# Patient Record
Sex: Female | Born: 1985 | Race: White | Hispanic: No | Marital: Single | Smoking: Current every day smoker
Health system: Southern US, Community
[De-identification: ages and names within clinical notes are randomized; demographics above are authoritative.]

## PROBLEM LIST (undated history)

## (undated) DIAGNOSIS — F1990 Other psychoactive substance use, unspecified, uncomplicated: Secondary | ICD-10-CM

---
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
## 2005-01-12 ENCOUNTER — Emergency Department: Payer: Self-pay | Admitting: Emergency Medicine

## 2005-06-01 ENCOUNTER — Emergency Department: Payer: Self-pay | Admitting: Emergency Medicine

## 2006-01-14 ENCOUNTER — Observation Stay: Payer: Self-pay | Admitting: Obstetrics & Gynecology

## 2006-01-23 ENCOUNTER — Observation Stay: Payer: Self-pay

## 2006-01-29 ENCOUNTER — Observation Stay: Payer: Self-pay | Admitting: Obstetrics & Gynecology

## 2006-01-30 ENCOUNTER — Inpatient Hospital Stay: Payer: Self-pay | Admitting: Obstetrics and Gynecology

## 2006-04-15 ENCOUNTER — Emergency Department: Payer: Self-pay | Admitting: Emergency Medicine

## 2007-12-22 ENCOUNTER — Emergency Department: Payer: Self-pay | Admitting: Emergency Medicine

## 2010-11-02 ENCOUNTER — Emergency Department: Payer: Self-pay | Admitting: Emergency Medicine

## 2010-11-23 ENCOUNTER — Emergency Department: Payer: Self-pay | Admitting: Emergency Medicine

## 2010-12-14 ENCOUNTER — Emergency Department: Payer: Self-pay | Admitting: Internal Medicine

## 2011-01-09 ENCOUNTER — Emergency Department: Payer: Self-pay | Admitting: Emergency Medicine

## 2011-01-20 ENCOUNTER — Emergency Department: Payer: Self-pay | Admitting: Internal Medicine

## 2011-01-27 ENCOUNTER — Emergency Department: Payer: Self-pay | Admitting: Emergency Medicine

## 2011-03-29 ENCOUNTER — Emergency Department: Payer: Self-pay | Admitting: *Deleted

## 2012-02-09 ENCOUNTER — Emergency Department: Payer: Self-pay | Admitting: Emergency Medicine

## 2012-02-09 LAB — URINALYSIS, COMPLETE
Bilirubin,UR: NEGATIVE
Blood: NEGATIVE
Glucose,UR: NEGATIVE mg/dL (ref 0–75)
Ketone: NEGATIVE
Specific Gravity: 1.004 (ref 1.003–1.030)
Squamous Epithelial: 2
WBC UR: 4 /HPF (ref 0–5)

## 2012-02-09 LAB — CBC WITH DIFFERENTIAL/PLATELET
Basophil #: 0.1 10*3/uL (ref 0.0–0.1)
Basophil %: 0.5 %
Eosinophil %: 0.4 %
HCT: 44.7 % (ref 35.0–47.0)
HGB: 15.4 g/dL (ref 12.0–16.0)
Lymphocyte #: 0.5 10*3/uL — ABNORMAL LOW (ref 1.0–3.6)
Lymphocyte %: 4.8 %
MCV: 87 fL (ref 80–100)
Monocyte #: 0.1 x10 3/mm — ABNORMAL LOW (ref 0.2–0.9)
Monocyte %: 0.6 %
Neutrophil #: 10.7 10*3/uL — ABNORMAL HIGH (ref 1.4–6.5)
RBC: 5.14 10*6/uL (ref 3.80–5.20)
RDW: 14.7 % — ABNORMAL HIGH (ref 11.5–14.5)
WBC: 11.4 10*3/uL — ABNORMAL HIGH (ref 3.6–11.0)

## 2012-02-09 LAB — BASIC METABOLIC PANEL
BUN: 6 mg/dL — ABNORMAL LOW (ref 7–18)
Calcium, Total: 9.1 mg/dL (ref 8.5–10.1)
Chloride: 105 mmol/L (ref 98–107)
Co2: 25 mmol/L (ref 21–32)
Creatinine: 0.82 mg/dL (ref 0.60–1.30)
EGFR (African American): >60
EGFR (Non-African Amer.): >60
Glucose: 115 mg/dL — ABNORMAL HIGH (ref 65–99)
Osmolality: 278 (ref 275–301)
Sodium: 140 mmol/L (ref 136–145)

## 2012-02-09 LAB — HEPATIC FUNCTION PANEL A (ARMC)
Albumin: 3.7 g/dL (ref 3.4–5.0)
Bilirubin, Direct: 0.1 mg/dL (ref 0.00–0.20)
Bilirubin,Total: 0.7 mg/dL (ref 0.2–1.0)

## 2012-02-09 LAB — LIPASE, BLOOD: Lipase: 99 U/L (ref 73–393)

## 2012-07-15 ENCOUNTER — Emergency Department: Payer: Self-pay | Admitting: Emergency Medicine

## 2012-07-16 LAB — COMPREHENSIVE METABOLIC PANEL
Albumin: 3.4 g/dL (ref 3.4–5.0)
Anion Gap: 7 (ref 7–16)
Bilirubin,Total: 0.2 mg/dL (ref 0.2–1.0)
Calcium, Total: 8.1 mg/dL — ABNORMAL LOW (ref 8.5–10.1)
Chloride: 111 mmol/L — ABNORMAL HIGH (ref 98–107)
Co2: 25 mmol/L (ref 21–32)
Creatinine: 0.73 mg/dL (ref 0.60–1.30)
EGFR (African American): >60
Glucose: 100 mg/dL — ABNORMAL HIGH (ref 65–99)
Osmolality: 283 (ref 275–301)
Potassium: 3.3 mmol/L — ABNORMAL LOW (ref 3.5–5.1)
SGPT (ALT): 22 U/L (ref 12–78)
Sodium: 143 mmol/L (ref 136–145)

## 2012-07-16 LAB — CBC
HCT: 40.1 % (ref 35.0–47.0)
MCH: 27.9 pg (ref 26.0–34.0)
MCV: 89 fL (ref 80–100)
RDW: 13.7 % (ref 11.5–14.5)
WBC: 12.4 10*3/uL — ABNORMAL HIGH (ref 3.6–11.0)

## 2012-10-19 ENCOUNTER — Emergency Department: Payer: Self-pay | Admitting: Emergency Medicine

## 2012-11-02 ENCOUNTER — Emergency Department: Payer: Self-pay | Admitting: Emergency Medicine

## 2012-12-29 ENCOUNTER — Emergency Department: Payer: Self-pay | Admitting: Emergency Medicine

## 2013-01-06 IMAGING — CT CT ABD-PELV W/O CM
1 of 2 series · 15 of 32 positions shown, 19 images · non-contrast
Comparison: none

REASON FOR EXAM: (1) b/l severe flank pain with unremarkable urinalysis x
2rbc. hx prior renal su
COMMENTS:

[Series 2: soft tissue · axial · 0.68mm/px · z∈[-518,-102]mm · 15 of 153 slices shown, 19 images]
[im 7/153  soft-tissue]
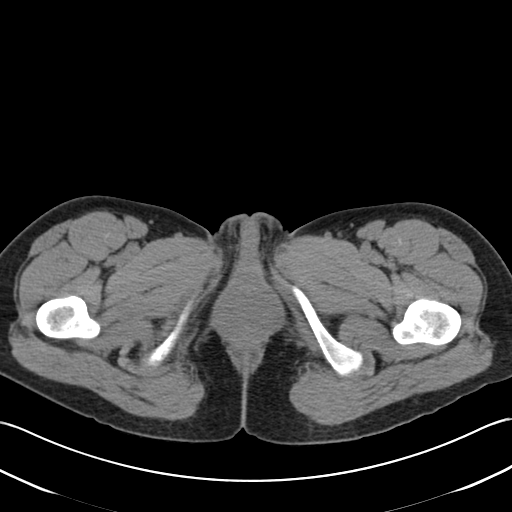
[im 7/153  bone]
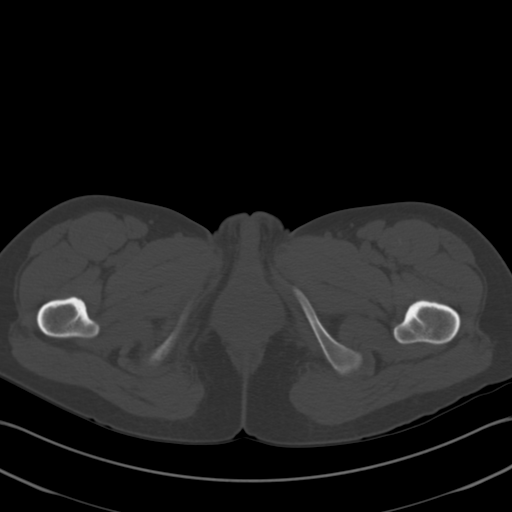
[im 20/153  soft-tissue]
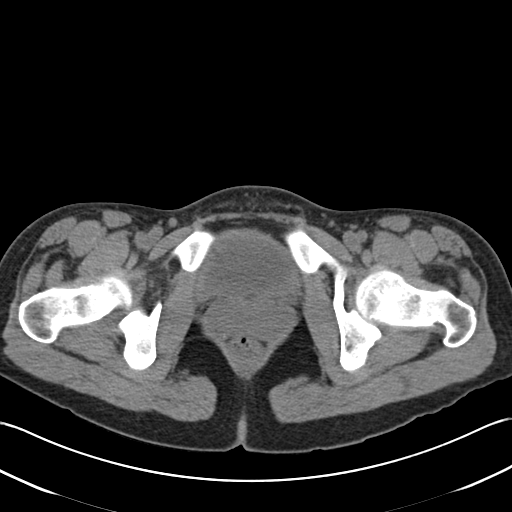
[im 32/153  soft-tissue]
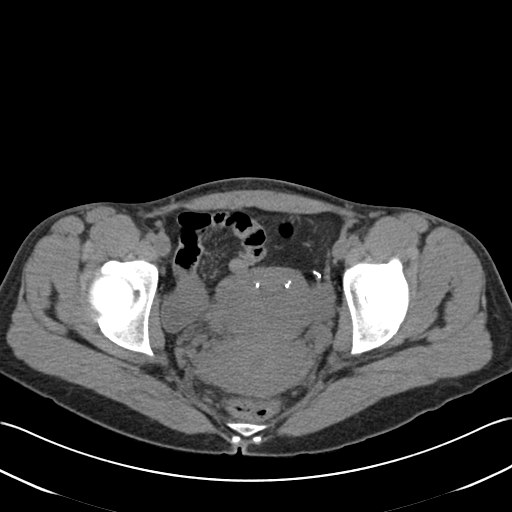
[im 45/153  soft-tissue]
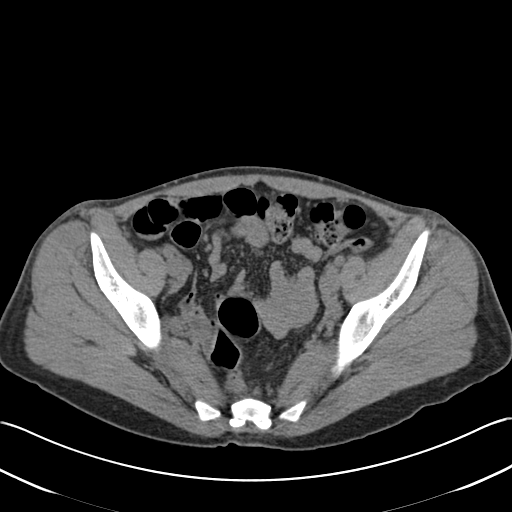
[im 51/153  soft-tissue]
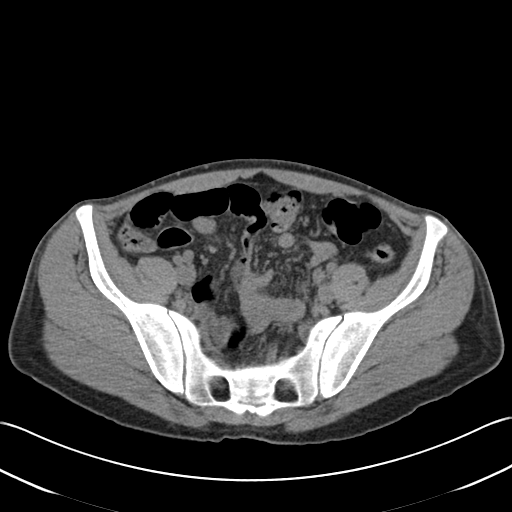
[im 64/153  soft-tissue]
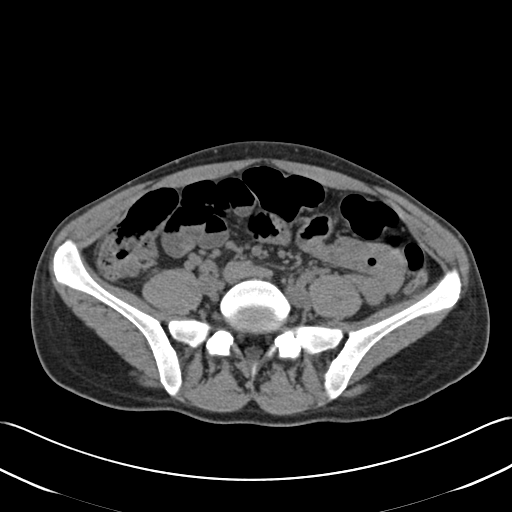
[im 77/153  soft-tissue]
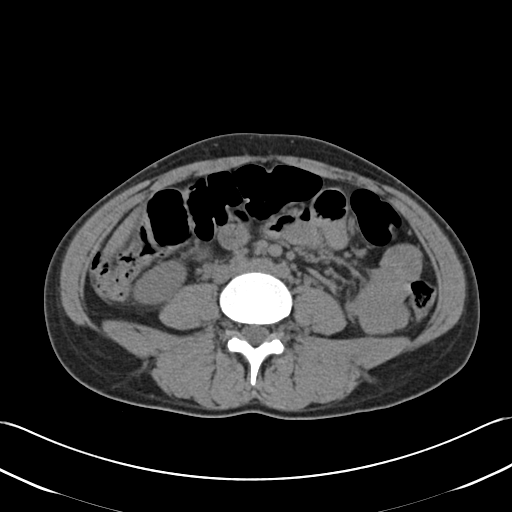
[im 89/153  soft-tissue]
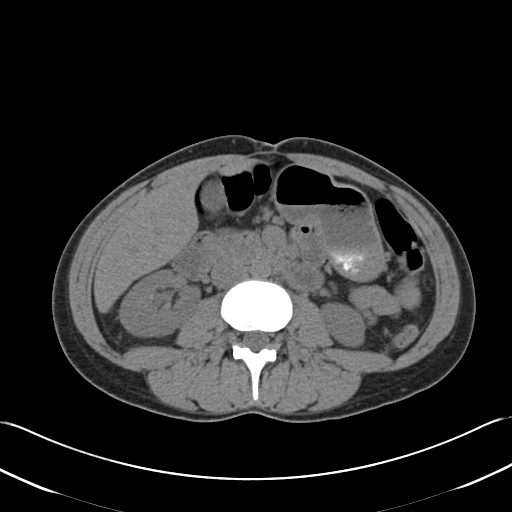
[im 102/153  soft-tissue]
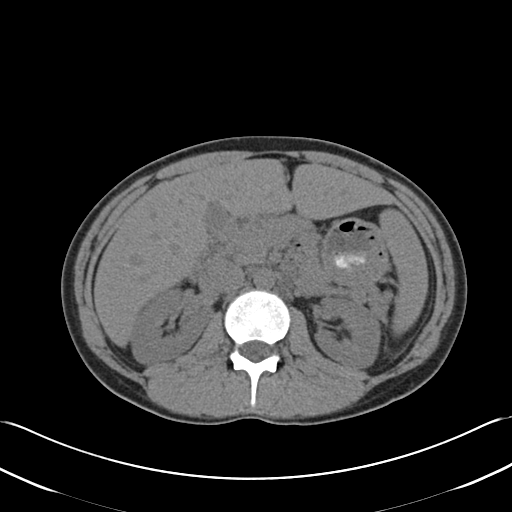
[im 102/153  bone]
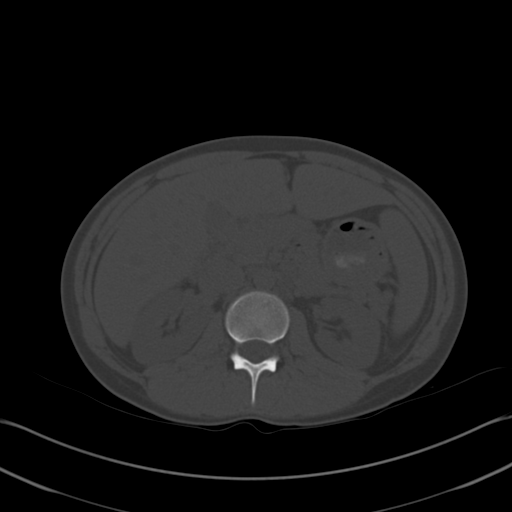
[im 108/153  soft-tissue]
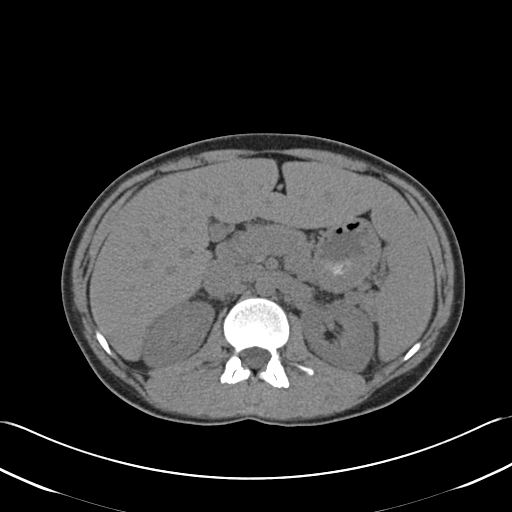
[im 121/153  soft-tissue]
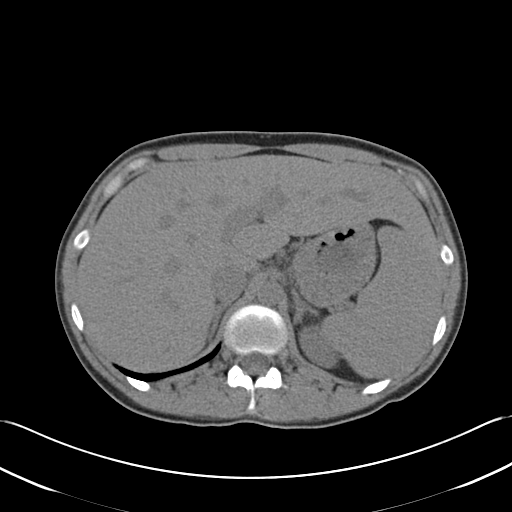
[im 127/153  lung]
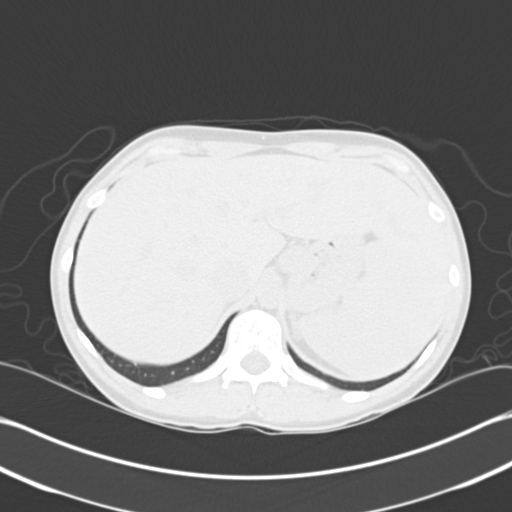
[im 134/153  soft-tissue]
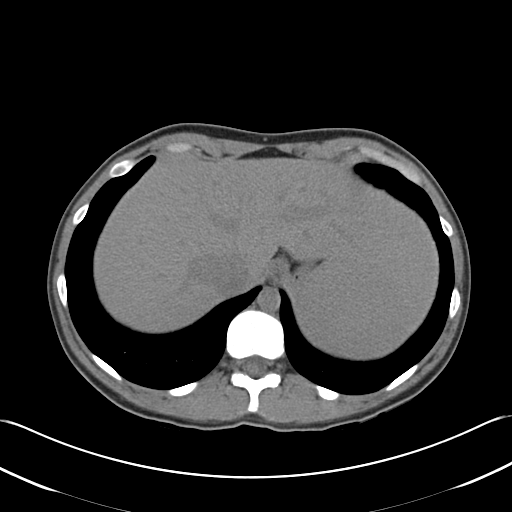
[im 134/153  lung]
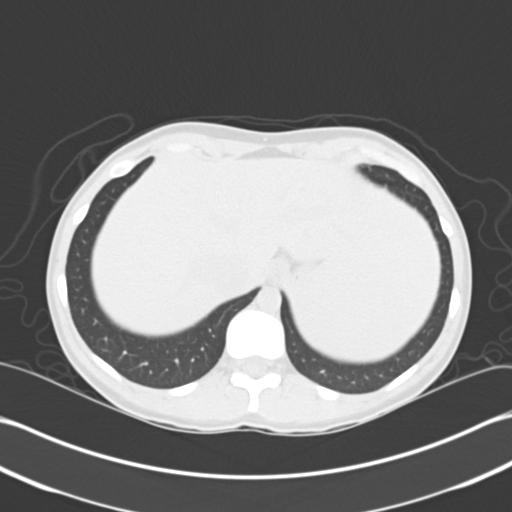
[im 140/153  lung]
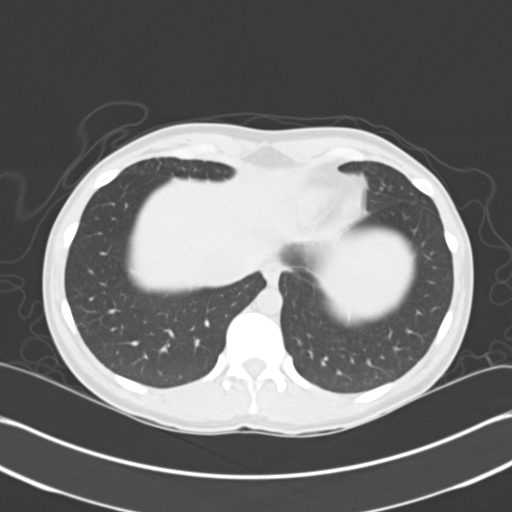
[im 146/153  soft-tissue]
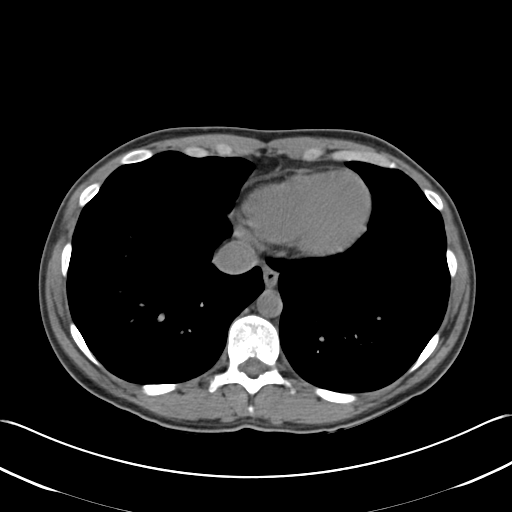
[im 146/153  lung]
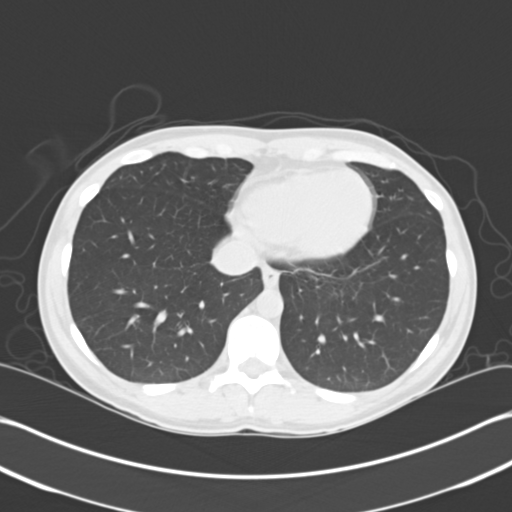

[15 of 32 positions shown; findings below may reference images not displayed]

PROCEDURE:     CT  - CT ABDOMEN AND PELVIS W[DATE]  [DATE]

RESULT:     CT of the abdomen and pelvis without contrast is performed in
the standard fashion with images reconstructed at 3.0 mm slice thickness in
the axial plane. There is no previous study for comparison.

There is no nephrolithiasis or hydronephrosis. No perinephric stranding or
abnormal perinephric fluid collection is seen. The urinary bladder is
unremarkable. The appendix is normal in appearance. No abnormal bowel
distention or wall thickening is evident. Noncontrast images of the liver,
spleen, gallbladder, adrenal glands, aorta and pancreas appear within normal
limits. There is no ascites or abnormal fluid collection. The abdominal wall
appears intact. The lung bases are clear.
IMPRESSION: 1. Unremarkable CT of the abdomen and pelvis.

[REDACTED]

## 2013-03-31 ENCOUNTER — Emergency Department: Payer: Self-pay | Admitting: Emergency Medicine

## 2014-09-11 NOTE — Op Note (Signed)
PATIENT NAME:  Victoria Glover, Victoria Glover MR#:  [id number] DATE OF BIRTH:  Jan 10, 1986  DATE OF PROCEDURE:  07/16/2012  PREOPERATIVE DIAGNOSIS: Left elbow abscess.   POSTOPERATIVE DIAGNOSIS: Left elbow abscess.   OPERATION: Incision and drainage.   ANESTHESIA: Local.   SURGEON: Quentin Orealph L. Ely, III, MD   OPERATIVE PROCEDURE: With the patient in the supine position after the induction of appropriate superficial prep, the abscess was injected with 4 mL of 1% Xylocaine without epinephrine. The area had been adequately prepped. An 11 blade was used to incise the abscess.  A large amount of purulent material was removed. It was too difficult to place a packing in the wound because of her discomfort. A small corner of a 4 x 4 was inserted between the skin edge to keep it apart and the arm wrapped sterilely. The patient was given pain medication and will be evaluated as an outpatient.    ____________________________ Carmie Endalph L. Ely III, MD rle:cb D: 07/16/2012 04:26:06 ET T: 07/16/2012 11:01:34 ET JOB#: [id number]  cc: Quentin Orealph L. Ely III, MD, <Dictator> Quentin OreALPH L ELY MD ELECTRONICALLY SIGNED 07/16/2012 23:19

## 2015-07-16 ENCOUNTER — Encounter: Payer: Self-pay | Admitting: Emergency Medicine

## 2015-07-16 ENCOUNTER — Emergency Department
Admission: EM | Admit: 2015-07-16 | Discharge: 2015-07-17 | Disposition: A | Discharge status: Other Acute Inpatient Hospital | Payer: Self-pay | Attending: Emergency Medicine | Admitting: Emergency Medicine

## 2015-07-16 DIAGNOSIS — F1994 Other psychoactive substance use, unspecified with psychoactive substance-induced mood disorder: Secondary | ICD-10-CM

## 2015-07-16 DIAGNOSIS — F1210 Cannabis abuse, uncomplicated: Secondary | ICD-10-CM | POA: Insufficient documentation

## 2015-07-16 DIAGNOSIS — F1110 Opioid abuse, uncomplicated: Secondary | ICD-10-CM | POA: Insufficient documentation

## 2015-07-16 DIAGNOSIS — Z3202 Encounter for pregnancy test, result negative: Secondary | ICD-10-CM | POA: Insufficient documentation

## 2015-07-16 DIAGNOSIS — R451 Restlessness and agitation: Secondary | ICD-10-CM | POA: Insufficient documentation

## 2015-07-16 DIAGNOSIS — F1510 Other stimulant abuse, uncomplicated: Secondary | ICD-10-CM | POA: Insufficient documentation

## 2015-07-16 DIAGNOSIS — F1410 Cocaine abuse, uncomplicated: Secondary | ICD-10-CM | POA: Insufficient documentation

## 2015-07-16 DIAGNOSIS — R1030 Lower abdominal pain, unspecified: Secondary | ICD-10-CM | POA: Insufficient documentation

## 2015-07-16 DIAGNOSIS — F17210 Nicotine dependence, cigarettes, uncomplicated: Secondary | ICD-10-CM | POA: Insufficient documentation

## 2015-07-16 HISTORY — DX: Other psychoactive substance use, unspecified, uncomplicated: F19.90

## 2015-07-16 LAB — COMPREHENSIVE METABOLIC PANEL
ALK PHOS: 97 U/L (ref 38–126)
ALT: 109 U/L — ABNORMAL HIGH (ref 14–54)
ANION GAP: 7 (ref 5–15)
AST: 59 U/L — ABNORMAL HIGH (ref 15–41)
Albumin: 4.1 g/dL (ref 3.5–5.0)
BILIRUBIN TOTAL: 0.6 mg/dL (ref 0.3–1.2)
BUN: 17 mg/dL (ref 6–20)
CALCIUM: 8.8 mg/dL — AB (ref 8.9–10.3)
CO2: 21 mmol/L — ABNORMAL LOW (ref 22–32)
Chloride: 105 mmol/L (ref 101–111)
Creatinine, Ser: 1.24 mg/dL — ABNORMAL HIGH (ref 0.44–1.00)
GFR calc non Af Amer: 58 mL/min — ABNORMAL LOW (ref >60)
Glucose, Bld: 130 mg/dL — ABNORMAL HIGH (ref 65–99)
Potassium: 4.1 mmol/L (ref 3.5–5.1)
Sodium: 133 mmol/L — ABNORMAL LOW (ref 135–145)
TOTAL PROTEIN: 7.9 g/dL (ref 6.5–8.1)

## 2015-07-16 LAB — CBC
HCT: 42.5 % (ref 35.0–47.0)
Hemoglobin: 14 g/dL (ref 12.0–16.0)
MCH: 30.1 pg (ref 26.0–34.0)
MCHC: 32.9 g/dL (ref 32.0–36.0)
MCV: 91.3 fL (ref 80.0–100.0)
Platelets: 285 10*3/uL (ref 150–440)
RBC: 4.66 MIL/uL (ref 3.80–5.20)
RDW: 13.8 % (ref 11.5–14.5)
WBC: 15 10*3/uL — ABNORMAL HIGH (ref 3.6–11.0)

## 2015-07-16 LAB — SALICYLATE LEVEL

## 2015-07-16 LAB — ACETAMINOPHEN LEVEL

## 2015-07-16 LAB — ETHANOL

## 2015-07-16 MED ORDER — ZIPRASIDONE MESYLATE 20 MG IM SOLR
20.0000 mg | Freq: Once | INTRAMUSCULAR | Status: AC
  Administered 2015-07-16: 20 mg via INTRAMUSCULAR
  Filled 2015-07-16: qty 20

## 2015-07-16 NOTE — ED Notes (Signed)
Spoke with representative from [facility] and states pt is too hysterical at present, jumping at the camera and yelling.. That it she has calmed down in a couple of hours they can reattempt.Marland Kitchen
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
## 2015-07-16 NOTE — BH Assessment (Signed)
BHH Assessment Progress Note Patient was highly agitated and declined assessment. Patient was very upset and difficult to understand, patient became highly agitated screaming expletives at this writer stating she should not be here.  Patient was also vomiting and could not stay in her bed going out of view of the tele-psych camera.This Clinical research associate contacted [facility] and spoke to staff in reference to taking the assessment order out and having patient assessed later this date once she is calmer. Patient UDS is pending.
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
## 2015-07-16 NOTE — ED Notes (Signed)
Pt yelling and cussing at staff, throwing stuff around the room. This tech has spoke with pt about using profanity and yelling at staff and pt continues
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
## 2015-07-16 NOTE — BHH Counselor (Signed)
Pt continues to not participate in the assessment.  Pt UDS is still pending.

## 2015-07-16 NOTE — ED Notes (Signed)
Spoke with pt boyfriend Neita Goodnight, states he can be contacted at [phone] and [phone] states he lives 2.5hrs away.. States the pt came home today to visit her fathers grave, states she gets upset a lot when she talks about her father.Marland Kitchen

## 2015-07-17 ENCOUNTER — Inpatient Hospital Stay
Admission: RE | Admit: 2015-07-17 | Discharge: 2015-07-18 | DRG: 897 | Disposition: A | Discharge status: Home / Self Care | Payer: Self-pay | Source: Ambulatory Visit | Attending: Psychiatry | Admitting: Psychiatry

## 2015-07-17 DIAGNOSIS — Z888 Allergy status to other drugs, medicaments and biological substances status: Secondary | ICD-10-CM

## 2015-07-17 DIAGNOSIS — F17200 Nicotine dependence, unspecified, uncomplicated: Secondary | ICD-10-CM | POA: Diagnosis present

## 2015-07-17 DIAGNOSIS — R45851 Suicidal ideations: Secondary | ICD-10-CM | POA: Diagnosis present

## 2015-07-17 DIAGNOSIS — Z62810 Personal history of physical and sexual abuse in childhood: Secondary | ICD-10-CM | POA: Diagnosis present

## 2015-07-17 DIAGNOSIS — F1120 Opioid dependence, uncomplicated: Secondary | ICD-10-CM | POA: Diagnosis present

## 2015-07-17 DIAGNOSIS — F1590 Other stimulant use, unspecified, uncomplicated: Secondary | ICD-10-CM | POA: Diagnosis present

## 2015-07-17 DIAGNOSIS — D72829 Elevated white blood cell count, unspecified: Secondary | ICD-10-CM | POA: Diagnosis present

## 2015-07-17 DIAGNOSIS — F1994 Other psychoactive substance use, unspecified with psychoactive substance-induced mood disorder: Secondary | ICD-10-CM

## 2015-07-17 DIAGNOSIS — F1914 Other psychoactive substance abuse with psychoactive substance-induced mood disorder: Principal | ICD-10-CM | POA: Diagnosis present

## 2015-07-17 DIAGNOSIS — F1220 Cannabis dependence, uncomplicated: Secondary | ICD-10-CM | POA: Diagnosis present

## 2015-07-17 DIAGNOSIS — N390 Urinary tract infection, site not specified: Secondary | ICD-10-CM | POA: Diagnosis present

## 2015-07-17 DIAGNOSIS — F17210 Nicotine dependence, cigarettes, uncomplicated: Secondary | ICD-10-CM | POA: Diagnosis present

## 2015-07-17 LAB — URINE DRUG SCREEN, QUALITATIVE (ARMC ONLY)
AMPHETAMINES, UR SCREEN: POSITIVE — AB
Barbiturates, Ur Screen: NOT DETECTED
Benzodiazepine, Ur Scrn: NOT DETECTED
Cannabinoid 50 Ng, Ur ~~LOC~~: POSITIVE — AB
Cocaine Metabolite,Ur ~~LOC~~: POSITIVE — AB
MDMA (ECSTASY) UR SCREEN: NOT DETECTED
Methadone Scn, Ur: NOT DETECTED
Opiate, Ur Screen: POSITIVE — AB
PHENCYCLIDINE (PCP) UR S: NOT DETECTED
TRICYCLIC, UR SCREEN: NOT DETECTED

## 2015-07-17 LAB — URINALYSIS COMPLETE WITH MICROSCOPIC (ARMC ONLY)
Bilirubin Urine: NEGATIVE
GLUCOSE, UA: NEGATIVE mg/dL
Ketones, ur: NEGATIVE mg/dL
NITRITE: POSITIVE — AB
PROTEIN: NEGATIVE mg/dL
Specific Gravity, Urine: 1.016 (ref 1.005–1.030)
pH: 5 (ref 5.0–8.0)

## 2015-07-17 LAB — POCT PREGNANCY, URINE: PREG TEST UR: NEGATIVE

## 2015-07-17 MED ORDER — DIPHENHYDRAMINE HCL 25 MG PO CAPS
50.0000 mg | ORAL_CAPSULE | Freq: Once | ORAL | Status: AC
  Administered 2015-07-17: 50 mg via ORAL

## 2015-07-17 MED ORDER — FOSFOMYCIN TROMETHAMINE 3 G PO PACK
3.0000 g | PACK | Freq: Once | ORAL | Status: AC
  Administered 2015-07-17: 3 g via ORAL
  Filled 2015-07-17: qty 3

## 2015-07-17 MED ORDER — DIPHENHYDRAMINE HCL 25 MG PO CAPS
ORAL_CAPSULE | ORAL | Status: AC
  Administered 2015-07-17: 50 mg via ORAL
  Filled 2015-07-17: qty 2

## 2015-07-17 NOTE — ED Notes (Addendum)
Pt taken to bhu for a shower and will stay in bhu while waiting for admission to [facility].  Report given to [employer].

## 2015-07-17 NOTE — ED Provider Notes (Signed)
-----------------------------------------   10:44 AM on 07/17/2015 -----------------------------------------  Urinalysis is reviewed and is concerning for urinary tract infection. 3 g of fosfomycin ordered. Urine culture sent.  Gayla Doss, MD 07/17/15 1044

## 2015-07-17 NOTE — BH Assessment (Addendum)
Per Dr. Pucilowska, patient meets criteria for inpatient hospitalization and will be accepted to [facility] Unit.  Per Charge Nurse patient can come to the unit this afternoon.  Writer informed the ER MD (Dr. Gail) and the nurse working with the patient.    

## 2015-07-17 NOTE — ED Notes (Signed)
Patient came to nurse and stated that she had a rash on her stomach and back. Patient stated that she was itchy and nurse noticed that there were several small red bumps on both of her sides and the left side of her back. Awaiting orders from physician and physician assessment. Will continue to monitor.

## 2015-07-17 NOTE — ED Notes (Signed)
Pt is on the phone with Victoria Glover and is cursing and raising voice at him.  Pt briefly spoke to him and hung up on him.  Pt ambulated to bathroom without difficulty and was able to give a urine sample.  Pt discussing with myself wanting to leave.  Informed patient that she is not able to leave and that the psychiatrist will need to evaluate her first.  Also informed her that there is no certain time the psychiatrist will come and see her, but that was the next step in her treatment here.  Pt verbalized understanding, but was still agitated.  Pt did not yell at staff or make any gestures toward staff. Denies SI/HI.

## 2015-07-17 NOTE — Consult Note (Signed)
Reason for Consult:Agitation, Drug abuse  Referring Physician: [facility] EDP  Victoria Glover is an 30 y.o. female who was brought to the [facility] ED via police after they received calls that patient was found unresponsive in a neighborhood. Patient states "I don't know how I came to be here. The police brought me. I was just hanging out with my boyfriend and cousin. They were going to get some drugs. I've never been diagnosed with a condition. I'm always hyper and high strung. Always have a lot of energy. Sometimes I don't need much sleep for a day or two. Yes I get depressed. My father died when I was [age] and the anniversary just happened. I don't want to be on psychiatric medications. I smoke marijuana every day to help my anxiety and so I can eat. I take a friend's Adderrall occasionally because it helps me focus. I don't know how cocaine got into my system. I was drinking yesterday because I was depressed about my dad. I'm just angry I"m here. I want to go home."   HPI:   Victoria Glover is a 30 year old female who was placed under IVC by the police due to being found walking around confused. She is a poor historian in regards to the events leading up to this. Patient denies any past psychiatric history and is not currently receiving any mental health treatment. Shelle is cooperative with assessment but appears quite tense. She greatly minimizes her substance use as well as mental health symptoms. Patient reports symptoms that are consistent with anxiety, depression, and mood lability. Patient reports that she is not interested in receiving any mental health treatment but would prefer to continue smoking marijuana daily to control her anxiety and help stimulate appetite. She denies haven taken any previous psychiatric medications previously.  Her mood is angry and affect is congruent. Her insight is lacking and judgment is impaired. Her remote memory is intact but recent if fair most likely from substance  use. Patient denies any current symptoms of psychosis. She denies any suicidal or homicidal thoughts. Patient reports symptoms such as decreased need for sleep, racing thought, excess energy that could be suggestive of hypomania but unclear given her recent substance abuse. Her urine drug screen is positive for amphetamines, cocaine, marijuana, and opiates. Patient reports taking some narcotic pain pills for a foot injury but insists not taking any for several weeks. According to admission notes from the ED in epic that bystanders found the patient on the ground unresponsive. This is not consistent with the report that patient provided to this provider. The patient is currently being treated for a urinary tract infection. Patient appears to be a danger to herself at this time due to her substance abuse and untreated mental health symptoms. She was also very combative on arrival to the ED which required sedation with IM Geodon. Her thought processes are coherent. Her thought content involves not knowing why she is in the ED and wanting to leave. Patient is motivated to continue abusing substances.    Past Medical History  Diagnosis Date  . Drug use     History reviewed. No pertinent past surgical history.  No family history on file.  Social History:  reports that she has been smoking Cigarettes.  She has been smoking about 1.00 pack per day. She does not have any smokeless tobacco history on file. She reports that she drinks alcohol. She reports that she uses illicit drugs (Marijuana).  Allergies:  Allergies  Allergen  Reactions  . Acetaminophen Hives    Medications: I have reviewed the patient's current medications.  Results for orders placed or performed during the hospital encounter of 07/16/15 (from the past 48 hour(s))  Comprehensive metabolic panel     Status: Abnormal   Collection Time: 07/16/15  3:27 PM  Result Value Ref Range   Sodium 133 (L) 135 - 145 mmol/L   Potassium 4.1 3.5 -  5.1 mmol/L   Chloride 105 101 - 111 mmol/L   CO2 21 (L) 22 - 32 mmol/L   Glucose, Bld 130 (H) 65 - 99 mg/dL   BUN 17 6 - 20 mg/dL   Creatinine, Ser 1.24 (H) 0.44 - 1.00 mg/dL   Calcium 8.8 (L) 8.9 - 10.3 mg/dL   Total Protein 7.9 6.5 - 8.1 g/dL   Albumin 4.1 3.5 - 5.0 g/dL   AST 59 (H) 15 - 41 U/L   ALT 109 (H) 14 - 54 U/L   Alkaline Phosphatase 97 38 - 126 U/L   Total Bilirubin 0.6 0.3 - 1.2 mg/dL   GFR calc non Af Amer 58 (L) >60 mL/min   GFR calc Af Amer >60 >60 mL/min    Comment: (NOTE) The eGFR has been calculated using the CKD EPI equation. This calculation has not been validated in all clinical situations. eGFR's persistently <60 mL/min signify possible Chronic Kidney Disease.    Anion gap 7 5 - 15  Ethanol (ETOH)     Status: None   Collection Time: 07/16/15  3:27 PM  Result Value Ref Range   Alcohol, Ethyl (B) <5 <5 mg/dL    Comment:        LOWEST DETECTABLE LIMIT FOR SERUM ALCOHOL IS 5 mg/dL FOR MEDICAL PURPOSES ONLY   Salicylate level     Status: None   Collection Time: 07/16/15  3:27 PM  Result Value Ref Range   Salicylate Lvl <9.5 2.8 - 30.0 mg/dL  Acetaminophen level     Status: Abnormal   Collection Time: 07/16/15  3:27 PM  Result Value Ref Range   Acetaminophen (Tylenol), Serum <10 (L) 10 - 30 ug/mL    Comment:        THERAPEUTIC CONCENTRATIONS VARY SIGNIFICANTLY. A RANGE OF 10-30 ug/mL MAY BE AN EFFECTIVE CONCENTRATION FOR MANY PATIENTS. HOWEVER, SOME ARE BEST TREATED AT CONCENTRATIONS OUTSIDE THIS RANGE. ACETAMINOPHEN CONCENTRATIONS >150 ug/mL AT 4 HOURS AFTER INGESTION AND >50 ug/mL AT 12 HOURS AFTER INGESTION ARE OFTEN ASSOCIATED WITH TOXIC REACTIONS.   CBC     Status: Abnormal   Collection Time: 07/16/15  3:27 PM  Result Value Ref Range   WBC 15.0 (H) 3.6 - 11.0 K/uL   RBC 4.66 3.80 - 5.20 MIL/uL   Hemoglobin 14.0 12.0 - 16.0 g/dL   HCT 42.5 35.0 - 47.0 %   MCV 91.3 80.0 - 100.0 fL   MCH 30.1 26.0 - 34.0 pg   MCHC 32.9 32.0 - 36.0  g/dL   RDW 13.8 11.5 - 14.5 %   Platelets 285 150 - 440 K/uL  Urine Drug Screen, Qualitative ([facility] only)     Status: Abnormal   Collection Time: 07/17/15  9:18 AM  Result Value Ref Range   Tricyclic, Ur Screen NONE DETECTED NONE DETECTED   Amphetamines, Ur Screen POSITIVE (A) NONE DETECTED   MDMA (Ecstasy)Ur Screen NONE DETECTED NONE DETECTED   Cocaine Metabolite,Ur [place] POSITIVE (A) NONE DETECTED   Opiate, Ur Screen POSITIVE (A) NONE DETECTED   Phencyclidine (PCP) Ur S NONE DETECTED  NONE DETECTED   Cannabinoid 50 Ng, Ur [place] POSITIVE (A) NONE DETECTED   Barbiturates, Ur Screen NONE DETECTED NONE DETECTED   Benzodiazepine, Ur Scrn NONE DETECTED NONE DETECTED   Methadone Scn, Ur NONE DETECTED NONE DETECTED    Comment: (NOTE) 751  Tricyclics, urine               Cutoff 1000 ng/mL 200  Amphetamines, urine             Cutoff 1000 ng/mL 300  MDMA (Ecstasy), urine           Cutoff 500 ng/mL 400  Cocaine Metabolite, urine       Cutoff 300 ng/mL 500  Opiate, urine                   Cutoff 300 ng/mL 600  Phencyclidine (PCP), urine      Cutoff 25 ng/mL 700  Cannabinoid, urine              Cutoff 50 ng/mL 800  Barbiturates, urine             Cutoff 200 ng/mL 900  Benzodiazepine, urine           Cutoff 200 ng/mL 1000 Methadone, urine                Cutoff 300 ng/mL 1100 1200 The urine drug screen provides only a preliminary, unconfirmed 1300 analytical test result and should not be used for non-medical 1400 purposes. Clinical consideration and professional judgment should 1500 be applied to any positive drug screen result due to possible 1600 interfering substances. A more specific alternate chemical method 1700 must be used in order to obtain a confirmed analytical result.  1800 Gas chromato graphy / mass spectrometry (GC/MS) is the preferred 1900 confirmatory method.   Urinalysis complete, with microscopic ([facility] only)     Status: Abnormal   Collection Time: 07/17/15  9:18 AM  Result Value  Ref Range   Color, Urine YELLOW (A) YELLOW   APPearance CLOUDY (A) CLEAR   Glucose, UA NEGATIVE NEGATIVE mg/dL   Bilirubin Urine NEGATIVE NEGATIVE   Ketones, ur NEGATIVE NEGATIVE mg/dL   Specific Gravity, Urine 1.016 1.005 - 1.030   Hgb urine dipstick 1+ (A) NEGATIVE   pH 5.0 5.0 - 8.0   Protein, ur NEGATIVE NEGATIVE mg/dL   Nitrite POSITIVE (A) NEGATIVE   Leukocytes, UA 1+ (A) NEGATIVE   RBC / HPF 0-5 0 - 5 RBC/hpf   WBC, UA TOO NUMEROUS TO COUNT 0 - 5 WBC/hpf   Bacteria, UA MANY (A) NONE SEEN   Squamous Epithelial / LPF 0-5 (A) NONE SEEN   WBC Clumps PRESENT    Mucous PRESENT    Ca Oxalate Crys, UA PRESENT     Comment: CORRECTED RESULTS CALLED TO:  SPOKE WITH ASHLEY ROSS AT 1026 07/17/15 INFORMED OF CORRECTION TO REPORT SDR   Pregnancy, urine POC     Status: None   Collection Time: 07/17/15  9:23 AM  Result Value Ref Range   Preg Test, Ur NEGATIVE NEGATIVE    Comment:        THE SENSITIVITY OF THIS METHODOLOGY IS >24 mIU/mL     No results found.  Review of Systems  Psychiatric/Behavioral: Positive for depression and substance abuse. Negative for suicidal ideas and hallucinations. The patient is nervous/anxious.    Blood pressure 109/66, pulse 83, temperature 99 F (37.2 C), temperature source Oral, resp. rate 18, height '5\' 4"'$  (1.626 m), weight  54.432 kg (120 lb), SpO2 98 %. Physical Exam  Assessment/Plan:  Substance induced mood disorder   Patient to be referred for inpatient treatment of substance abuse with possible underlying mood disorder.   Elmarie Shiley, NP-C 07/17/2015, 11:19 AM

## 2015-07-17 NOTE — ED Notes (Signed)
Pt's boyfriend at bedside with chaperone present.  Conversation started to escalate and patient started to raise voice saying she wanted to get out of here and using expletives.  She also stated she wanted him to get out of the room.  I did ask patient prior to him coming back to visit if she would allow him to come back in which she did agree to allow him to visit.  Family member has also been calling wanting to speak to the patient again in which I explained to him that she has already used the phone and cannot have phone calls any more.
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
## 2015-07-17 NOTE — ED Notes (Signed)

## 2015-07-17 NOTE — ED Notes (Signed)
Tele psych from [place] on computer screen in room.

## 2015-07-17 NOTE — ED Notes (Signed)
Pt awake and asking "can I leave now" RN Morrie Sheldon R notified

## 2015-07-17 NOTE — ED Notes (Signed)

## 2015-07-17 NOTE — ED Notes (Signed)
Resumed care from [facility].  Pt sleeping.  siderails up x 2.

## 2015-07-17 NOTE — ED Notes (Signed)
Pt. Discharged from ED to be admitted to [facility]. Room #303 per Dr. Demetrius Charity

## 2015-07-17 NOTE — Progress Notes (Addendum)
LCSW met with patient who reported she is interested in sleeping at this moment. LCSW discussed services and programs that could assist her and agreed to meet with her later. Handout left for patient.  LCSW and TTS consulted and we will await psychiatric recommendation- Pt is agreeably to be discharged and stated she is not homicidal or suicidal at this time.  [employer] LCSW [phone]

## 2015-07-17 NOTE — BH Assessment (Signed)
Per Vernona Rieger, NP - patient meets criteria for inpatient hospitalization.  Per [facility]) no appropriate beds.  TTS will refer patient to other facilities.

## 2015-07-17 NOTE — BH Assessment (Signed)
 NP- Vernona Rieger will assess the patient.

## 2015-07-17 NOTE — ED Notes (Signed)
BEHAVIORAL HEALTH ROUNDING Patient sleeping: No. Patient alert and oriented: yes Behavior appropriate: Yes.  ; If no, describe:  Nutrition and fluids offered: Yes  Toileting and hygiene offered: Yes  Sitter present: no Law enforcement present: Yes  

## 2015-07-17 NOTE — ED Provider Notes (Signed)
-----------------------------------------   6:12 AM on 07/17/2015 -----------------------------------------   Blood pressure 109/66, pulse 83, temperature 99 F (37.2 C), temperature source Oral, resp. rate 18, height  (1.626 m), weight 54.432 kg, SpO2 98 %.  The patient had no acute events since last update.  Calm and cooperative at this time.  Disposition is pending per Psychiatry/Behavioral Medicine team recommendations.     Loleta Rose, MD 07/17/15 [phone]

## 2015-07-17 NOTE — ED Notes (Signed)
Pt ate dinner tray and now sleeping again

## 2015-07-18 ENCOUNTER — Encounter: Payer: Self-pay | Admitting: Psychiatry

## 2015-07-18 DIAGNOSIS — F1220 Cannabis dependence, uncomplicated: Secondary | ICD-10-CM | POA: Diagnosis present

## 2015-07-18 DIAGNOSIS — F1590 Other stimulant use, unspecified, uncomplicated: Secondary | ICD-10-CM | POA: Diagnosis present

## 2015-07-18 DIAGNOSIS — F17200 Nicotine dependence, unspecified, uncomplicated: Secondary | ICD-10-CM | POA: Diagnosis present

## 2015-07-18 DIAGNOSIS — D72829 Elevated white blood cell count, unspecified: Secondary | ICD-10-CM | POA: Diagnosis present

## 2015-07-18 DIAGNOSIS — F1120 Opioid dependence, uncomplicated: Secondary | ICD-10-CM | POA: Diagnosis present

## 2015-07-18 DIAGNOSIS — N390 Urinary tract infection, site not specified: Secondary | ICD-10-CM | POA: Diagnosis present

## 2015-07-18 DIAGNOSIS — F1994 Other psychoactive substance use, unspecified with psychoactive substance-induced mood disorder: Secondary | ICD-10-CM

## 2015-07-18 MED ORDER — HYDROCORTISONE 1 % EX CREA
TOPICAL_CREAM | Freq: Two times a day (BID) | CUTANEOUS | Status: DC
  Administered 2015-07-18: 10:00:00 via TOPICAL
  Filled 2015-07-18: qty 28

## 2015-07-18 MED ORDER — ALUM & MAG HYDROXIDE-SIMETH 200-200-20 MG/5ML PO SUSP: 30.0000 mL | ORAL | Status: DC | PRN

## 2015-07-18 MED ORDER — NALOXONE HCL 4 MG/0.1ML NA LIQD: 4.0000 mg | Freq: Once | NASAL | Status: AC

## 2015-07-18 MED ORDER — MAGNESIUM HYDROXIDE 400 MG/5ML PO SUSP: 30.0000 mL | Freq: Every day | ORAL | Status: DC | PRN

## 2015-07-18 MED ORDER — TRAZODONE HCL 100 MG PO TABS
100.0000 mg | ORAL_TABLET | Freq: Every day | ORAL | Status: DC
  Administered 2015-07-18: 100 mg via ORAL
  Filled 2015-07-18: qty 1

## 2015-07-18 MED ORDER — NICOTINE 21 MG/24HR TD PT24
21.0000 mg | MEDICATED_PATCH | Freq: Every day | TRANSDERMAL | Status: DC
  Filled 2015-07-18: qty 1

## 2015-07-18 MED ORDER — IBUPROFEN 600 MG PO TABS: 600.0000 mg | ORAL_TABLET | Freq: Four times a day (QID) | ORAL | Status: DC | PRN

## 2015-07-18 MED ORDER — DIPHENHYDRAMINE HCL 25 MG PO CAPS: 50.0000 mg | ORAL_CAPSULE | Freq: Four times a day (QID) | ORAL | Status: DC | PRN

## 2015-07-18 NOTE — Tx Team (Signed)
Initial Interdisciplinary Treatment Plan   PATIENT STRESSORS: Substance abuse   PATIENT STRENGTHS: Average or above average intelligence Capable of independent living Communication skills   PROBLEM LIST: Problem List/Patient Goals Date to be addressed Date deferred Reason deferred Estimated date of resolution  Substance abuse 07-18-15                 " I don't have any problems that I want to work on"                                     DISCHARGE CRITERIA:  Ability to meet basic life and health needs Adequate post-discharge living arrangements  PRELIMINARY DISCHARGE PLAN: Outpatient therapy Return to previous living arrangement  PATIENT/FAMIILY INVOLVEMENT: This treatment plan has been presented to and reviewed with the patient, Victoria Glover, and/or family member, .  The patient and family have been given the opportunity to ask questions and make suggestions.  Ernesto Rutherford 07/18/2015, 1:23 AM

## 2015-07-18 NOTE — Progress Notes (Signed)
Patient ID: Victoria Glover, female   DOB: [birth year], 30 y.o.   MRN: [id number]  Pt refused follow up. Information to [facility] was provided.   Daisy Floro Tyshan Enderle MSW, LCSWA 07/18/2015 1:08 PM
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
## 2015-07-18 NOTE — Progress Notes (Signed)
Patient ID: Victoria Glover, female   DOB: 10-25-1985, 30 y.o.   MRN: [id number]  PSA was not completed. Pt was discharged within 24 hours of admission. Pt refused follow up and plans to return home with her sister.   Daisy Floro Cagney Steenson MSW, LCSWA  07/18/2015 2:20 PM

## 2015-07-18 NOTE — Progress Notes (Signed)
Pt discharged from beh. Med. All instructions reviewed with pt. Pt stated she understood and would comply with same. All pt's belongings returned.Pt denies SI and A/V hallucinations.
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
## 2015-07-18 NOTE — Progress Notes (Signed)
  [facility] Adult Case Management Discharge Plan :  Will you be returning to the same living situation after discharge:  Yes,  home At discharge, do you have transportation home?: Yes,  friend Do you have the ability to pay for your medications: Yes,  pt was not placed on any medications.   Release of information consent forms completed and in the chart;  Patient's signature needed at discharge.  Patient to Follow up at: Follow-up Information    Go to [facility].   Why:  *Information only* Walk in hours are Monday - Friday 8:00am - 4:00pm.    Contact information:   [address] [facility] [address] [phone]       Next level of care provider has access to [facility] Link:no  Safety Planning and Suicide Prevention discussed: Yes,  with patient and mother  Have you used any form of tobacco in the last 30 days? (Cigarettes, Smokeless Tobacco, Cigars, and/or Pipes): Yes  Has patient been referred to the Quitline?: Patient refused referral  Patient has been referred for addiction treatment: Pt. refused referral  Rondall Allegra MSW, LCSWA  07/18/2015, 2:23 PM
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
## 2015-07-18 NOTE — [facility] Suicide Risk Assessment (Signed)
[facility] INPATIENT:  Family/Significant Other Suicide Prevention Education  Suicide Prevention Education:  Education Completed; Celisa Schoenberg [phone] (mother) has been identified by the patient as the family member/significant other with whom the patient will be residing, and identified as the person(s) who will aid the patient in the event of a mental health crisis (suicidal ideations/suicide attempt).  With written consent from the patient, the family member/significant other has been provided the following suicide prevention education, prior to the and/or following the discharge of the patient.  The suicide prevention education provided includes the following:  Suicide risk factors  Suicide prevention and interventions  National Suicide Hotline telephone number  [facility] assessment telephone number  [facility] Emergency Assistance 911  [facility] and/or Residential Mobile Crisis Unit telephone number  Request made of family/significant other to:  Remove weapons (e.g., guns, rifles, knives), all items previously/currently identified as safety concern.    Remove drugs/medications (over-the-counter, prescriptions, illicit drugs), all items previously/currently identified as a safety concern.  The family member/significant other verbalizes understanding of the suicide prevention education information provided.  The family member/significant other agrees to remove the items of safety concern listed above.  [employer] MSW, LCSWA  07/18/2015, 2:18 PM
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
## 2015-07-18 NOTE — [facility] Group Notes (Signed)
[facility] Group Notes:  (Nursing/MHT/Case Management/Adjunct)  Date:  07/18/2015  Time:  11:49 AM  Type of Therapy:  Psychoeducational Skills  Participation Level:  Did Not Attend   Victoria Glover Victoria Glover Victoria Glover 07/18/2015, 11:49 AM 

## 2015-07-18 NOTE — [facility] Suicide Risk Assessment (Signed)
[facility] Admission Suicide Risk Assessment   Nursing information obtained from:    Demographic factors:    Current Mental Status:    Loss Factors:    Historical Factors:    Risk Reduction Factors:     Total Time spent with patient: 1 hour Principal Problem: Substance induced mood disorder ([place]) Diagnosis:   Patient Active Problem List   Diagnosis Date Noted  . Cannabis use disorder, moderate, dependence ([place]) [F12.20] 07/18/2015  . Stimulant use disorder ([place]) [F15.90] 07/18/2015  . Opioid use disorder, moderate, dependence ([place]) [F11.20] 07/18/2015  . Tobacco use disorder [F17.200] 07/18/2015  . UTI (urinary tract infection) [N39.0] 07/18/2015  . Leukocytosis [D72.829] 07/18/2015  . Substance induced mood disorder [facility]) [F19.94] 07/17/2015   Subjective Data: Substance abuse.  Continued Clinical Symptoms:  Alcohol Use Disorder Identification Test Final Score (AUDIT): 5 The "Alcohol Use Disorders Identification Test", Guidelines for Use in Primary Care, Second Edition.  World Pharmacologist [facility]). Score between 0-7:  no or low risk or alcohol related problems. Score between 8-15:  moderate risk of alcohol related problems. Score between 16-19:  high risk of alcohol related problems. Score 20 or above:  warrants further diagnostic evaluation for alcohol dependence and treatment.   CLINICAL FACTORS:   Depression:   Comorbid alcohol abuse/dependence Alcohol/Substance Abuse/Dependencies   Musculoskeletal: Strength & Muscle Tone: within normal limits Gait & Station: normal Patient leans: N/A  Psychiatric Specialty Exam: Review of Systems  All other systems reviewed and are negative.   Blood pressure 134/81, pulse 81, temperature 98.2 F (36.8 C), temperature source Oral, resp. rate 17, height '5\' 4"'$  (1.626 m), weight 54.432 kg (120 lb), SpO2 100 %.Body mass index is 20.59 kg/(m^2).  General Appearance: Fairly Groomed  Engineer, water::  Good  Speech:  Clear and Coherent   Volume:  Normal  Mood:  Euthymic  Affect:  Appropriate  Thought Process:  Goal Directed  Orientation:  Full (Time, Place, and Person)  Thought Content:  WDL  Suicidal Thoughts:  No  Homicidal Thoughts:  No  Memory:  Immediate;   Fair Recent;   Fair Remote;   Fair  Judgement:  Poor  Insight:  Shallow  Psychomotor Activity:  Normal  Concentration:  Fair  Recall:  [place]: Fair  Akathisia:  No  Handed:  Right  AIMS (if indicated):     Assets:  Communication Skills Desire for Improvement Housing Intimacy Physical Health Resilience Social Support  Sleep:  Number of Hours: 6.45  Cognition: WNL  ADL's:  Intact    COGNITIVE FEATURES THAT CONTRIBUTE TO RISK:  None    SUICIDE RISK:   Minimal: No identifiable suicidal ideation.  Patients presenting with no risk factors but with morbid ruminations; may be classified as minimal risk based on the severity of the depressive symptoms  PLAN OF CARE: Hospital admission, medication management, substance abuse counseling, discharge planning.  Ms. Koehne is a 30 year old female with a history of depression and substance use admitted after an overdose.  1. Suicidal ideation. The patient adamantly denies any thoughts intentions or plans to hurt herself or others. She is able to contract for safety. She is a loving mother. She is forward thinking and optimistic about the future.  2. Mood. The patient is not interested in psychopharmacology.  3. Substance abuse. On the day of admission to the emergency room the patient was drinking, using marijuana, cocaine, pain killers, and stimulants. She reports that she's been free of heroine for 7  months. She does not believe that she has a problem now. She does not need alcohol detox. She declined substance abuse treatment. She will be given prescription for naloxone kit.  4. UTI. 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 She was given Manurol in the ER.    5. Disposition. She will be discharged to home. She will be given information about [facility].   I certify that inpatient services furnished can reasonably be expected to improve the patient's condition.   Orson Slick, MD 07/18/2015, 11:08 AM
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
## 2015-07-19 LAB — URINE CULTURE
